# Patient Record
Sex: Female | Born: 1995 | Hispanic: No | Marital: Married | Smoking: Never smoker
Health system: Southern US, Community
[De-identification: ages and names within clinical notes are randomized; demographics above are authoritative.]

## PROBLEM LIST (undated history)

## (undated) ENCOUNTER — Inpatient Hospital Stay (HOSPITAL_COMMUNITY): Payer: Self-pay

## (undated) DIAGNOSIS — O209 Hemorrhage in early pregnancy, unspecified: Secondary | ICD-10-CM

## (undated) DIAGNOSIS — Z789 Other specified health status: Secondary | ICD-10-CM

## (undated) HISTORY — PX: NASAL ENDOSCOPY: SHX286

---

## 2014-05-01 ENCOUNTER — Inpatient Hospital Stay (HOSPITAL_COMMUNITY)
Admission: AD | Admit: 2014-05-01 | Discharge: 2014-05-02 | Disposition: A | Discharge status: Home / Self Care | Payer: Managed Care, Other (non HMO) | Source: Ambulatory Visit | Attending: Obstetrics & Gynecology | Admitting: Obstetrics & Gynecology

## 2014-05-01 ENCOUNTER — Encounter (HOSPITAL_COMMUNITY): Payer: Self-pay | Admitting: *Deleted

## 2014-05-01 DIAGNOSIS — R109 Unspecified abdominal pain: Secondary | ICD-10-CM | POA: Diagnosis present

## 2014-05-01 DIAGNOSIS — O200 Threatened abortion: Secondary | ICD-10-CM | POA: Insufficient documentation

## 2014-05-01 DIAGNOSIS — O209 Hemorrhage in early pregnancy, unspecified: Secondary | ICD-10-CM | POA: Diagnosis present

## 2014-05-01 DIAGNOSIS — O039 Complete or unspecified spontaneous abortion without complication: Secondary | ICD-10-CM

## 2014-05-01 HISTORY — DX: Hemorrhage in early pregnancy, unspecified: O20.9

## 2014-05-01 LAB — CBC
HCT: 38.8 % (ref 36.0–46.0)
Hemoglobin: 12.6 g/dL (ref 12.0–15.0)
MCH: 28.7 pg (ref 26.0–34.0)
MCHC: 32.5 g/dL (ref 30.0–36.0)
MCV: 88.4 fL (ref 78.0–100.0)
Platelets: 223 10*3/uL (ref 150–400)
RBC: 4.39 MIL/uL (ref 3.87–5.11)
RDW: 12.9 % (ref 11.5–15.5)
WBC: 8.8 10*3/uL (ref 4.0–10.5)

## 2014-05-01 NOTE — MAU Note (Signed)
Pt presents with husband who is speaking for pt, reports vaginal bleeding and pain since this pm, positive preg test

## 2014-05-02 ENCOUNTER — Encounter (HOSPITAL_COMMUNITY): Payer: Self-pay | Admitting: Certified Nurse Midwife

## 2014-05-02 ENCOUNTER — Inpatient Hospital Stay (HOSPITAL_COMMUNITY): Payer: Managed Care, Other (non HMO)

## 2014-05-02 DIAGNOSIS — O209 Hemorrhage in early pregnancy, unspecified: Secondary | ICD-10-CM

## 2014-05-02 HISTORY — DX: Hemorrhage in early pregnancy, unspecified: O20.9

## 2014-05-02 LAB — SAMPLE TO BLOOD BANK

## 2014-05-02 LAB — HCG, QUANTITATIVE, PREGNANCY: hCG, Beta Chain, Quant, S: 4054 m[IU]/mL — ABNORMAL HIGH (ref <5)

## 2014-05-02 NOTE — Discharge Instructions (Signed)
Miscarriage  A miscarriage is the loss of an unborn baby early in pregnancy. The cause is often unknown.  HOME CARE  You may be able to do light activity - rest at home until the bleeding has stopped.  Have help at home.  Only take medicine as told by your doctor.  Do not take aspirin.  Keep all doctor visits as told.  Give yourself time to grieve before trying to get pregnant again. GET HELP RIGHT AWAY IF:  You have bad cramps or pain in your back or belly.  You have a fever.  You pass large clumps of blood (clots) from your vagina that are walnut-sized or larger.   You pass large amounts of tissue from your vagina.   You have heavy  bleeding.  You have thick, bad-smelling fluid (discharge) coming from the vagina.  You get lightheaded, weak, or you pass out (faint).  You have chills. MAKE SURE YOU:  Understand these instructions.  Will watch your condition.  Will get help right away if you are not doing well or get worse.   Document Released: 11/11/2011 Document Reviewed: 11/11/2011 [facility] Patient Information 2015 [place], [place]. This information is not intended to replace advice given to you by your health care provider. Make sure you discuss any questions you have with your health care provider.

## 2014-05-02 NOTE — MAU Provider Note (Signed)
Reviewed and agree with note and plan. V.Erland Vivas, MD  

## 2014-05-02 NOTE — MAU Provider Note (Signed)
  History    CSN: [id number]  Arrival date and time: 05/01/14 2246 Nurse call to provider @ 2321 Provider here to evaluate patient @ 2400   No chief complaint on file.  HPI : positive pregnancy test confirmed at [facility] last week          cramping and bleeding tonight  History reviewed. No pertinent past medical history.  History reviewed. No pertinent past surgical history.  History reviewed. No pertinent family history.  History  Substance Use Topics  . Smoking status: Not on file  . Smokeless tobacco: Never Used  . Alcohol Use: No   Allergies: Allergies not on file  No prescriptions prior to admission   ROS bleeding - bright red / saturating pad 1 per hour x 2 hours cramping and pain weak and dizzy  Physical Exam   Blood pressure 140/86, pulse 95, temperature 98.4 F (36.9 C), temperature source Oral, resp. rate 18, height  (1.651 m), weight 70.308 kg (155 lb), last menstrual period 03/15/2014, SpO2 100.00%.  Physical Exam alert and oriented - anxious abdomen soft and non-tender speculum exam - moderate red blood in vaginal vault                             - cervix appears closed with small red bloody mucus from os                            - uterus retroverted  CBC: 8.8 - 12.6 - 38.8 - 223 Quant : 4054  ABO/RH pending from office from 8/27 - obtain results in AM  plan Rhophylac in office today if RH negative  MAU Course  Procedures - sono   CLINICAL DATA: Bleeding and pain, quantitative beta HCG 4,054.   EXAM:  OBSTETRIC <14 WK [place] AND TRANSVAGINAL OB US   TECHNIQUE:  Both transabdominal and transvaginal ultrasound examinations were  performed for complete evaluation of the gestation as well as the  maternal uterus, adnexal regions, and pelvic cul-de-sac.  Transvaginal technique was performed to assess early pregnancy.  COMPARISON: None.   FINDINGS:  Intrauterine gestational sac: Not identified  Yolk sac: Not identified  Embryo: Not  identified  Maternal uterus/adnexae: Small amount of fluid is noted within the  endometrial cavity. Normal sonographic appearance to the ovaries. No  free fluid.   IMPRESSION:  Small amount of fluid within the endometrial cavity. No intrauterine  gestation. Recommend correlation with serial quantitative beta HCG.  Assessment and Plan  [redacted] weeks pregnant with bleeding - most likely consistent with SAB discussed results with patient and family - emotional & distraught with grief  1) DC home 2) follow-up call from office this AM - need type and RH from office / decision for Rhophylac 3) repeat Quant and OV with Dr Juliene Pina in 2 days 4) grief support   Marlinda Mike 05/02/2014, 12:08 AM

## 2014-06-05 ENCOUNTER — Inpatient Hospital Stay (HOSPITAL_COMMUNITY)
Admission: AD | Admit: 2014-06-05 | Discharge: 2014-06-05 | Disposition: A | Discharge status: Home / Self Care | Payer: Managed Care, Other (non HMO) | Source: Ambulatory Visit | Attending: Obstetrics & Gynecology | Admitting: Obstetrics & Gynecology

## 2014-06-05 ENCOUNTER — Encounter (HOSPITAL_COMMUNITY): Payer: Self-pay | Admitting: *Deleted

## 2014-06-05 DIAGNOSIS — O200 Threatened abortion: Secondary | ICD-10-CM | POA: Insufficient documentation

## 2014-06-05 DIAGNOSIS — O4691 Antepartum hemorrhage, unspecified, first trimester: Secondary | ICD-10-CM | POA: Diagnosis present

## 2014-06-05 DIAGNOSIS — Z3A01 Less than 8 weeks gestation of pregnancy: Secondary | ICD-10-CM | POA: Diagnosis not present

## 2014-06-05 HISTORY — DX: Other specified health status: Z78.9

## 2014-06-05 LAB — HCG, SERUM, QUALITATIVE: Preg, Serum: POSITIVE — AB

## 2014-06-05 LAB — HCG, QUANTITATIVE, PREGNANCY: hCG, Beta Chain, Quant, S: 4483 m[IU]/mL — ABNORMAL HIGH (ref <5)

## 2014-06-05 NOTE — MAU Note (Signed)
Pt notified of need for blood work to confirm pregnancy. Pt verbalized understanding and will wait for bloodwork

## 2014-06-05 NOTE — MAU Note (Signed)
Received orders from Marlinda Mikeanya Bailey CNM for qualitative Hcg. If positive, order quantitative Hcg

## 2014-06-05 NOTE — MAU Note (Signed)
Lab spoke with pt regarding bloodwork. Pt requesting to try to give urine sample again before having blood drawn

## 2014-06-05 NOTE — MAU Note (Signed)
Pt's husband came to desk & said that they had been waiting in a waiting room down the hall. Requested that they wait in the MAU lobby so we could find them.

## 2014-06-05 NOTE — MAU Note (Signed)
Pt not in lobby.  

## 2014-06-05 NOTE — MAU Provider Note (Signed)
  History     CSN: [id number]  Arrival date and time: 06/05/14 1820   Chief Complaint  Patient presents with  . Vaginal Bleeding  . Possible Pregnancy   HPI [id number], LMP 8/19, c/o spotting on wiping once today, recent SAB in July, pt pregnant in next cycle. Some pelvic pain.  Rh positive per office records    Past Medical History  Diagnosis Date  . Bleeding in early pregnancy 05/02/2014  . Medical history non-contributory     Past Surgical History  Procedure Laterality Date  . Nasal endoscopy      History reviewed. No pertinent family history.  History  Substance Use Topics  . Smoking status: Never Smoker   . Smokeless tobacco: Never Used  . Alcohol Use: No    Allergies: No Known Allergies  Prescriptions prior to admission  Medication Sig Dispense Refill  . prenatal vitamin w/FE, FA (PRENATAL 1 + 1) 27-1 MG TABS tablet Take 1 tablet by mouth daily at 12 noon.        ROS Physical Exam   Blood pressure 138/79, pulse 96, temperature 97.8 F (36.6 C), resp. rate 18, last menstrual period 04/20/2014.  Physical Exam VS nl  Abdomen soft Pelvic deferred since one spotting noted.   MAU Course  Procedures Quant - 4400, progesterone level sent   Assessment and Plan  Threatened abortion, 6 wks. Needs repeat quant in 48 hrs in office and progesterone supplement if low level.  Pelvic rest. F/up office for lab in 48 hrs and then sono after rise noted.    Indi Willhite R 06/05/2014, 10:56 PM

## 2014-06-05 NOTE — MAU Note (Signed)
Pt presents to MAU with complaints of a + HPT two days ago and started having vaginal bleeding today

## 2014-06-05 NOTE — MAU Note (Signed)
[facility] and ready to have blood drawn. Lab called to draw blood

## 2014-06-05 NOTE — Progress Notes (Signed)
Notified of pt in MAU and inability to urinate to confirm positive home pregnancy test

## 2014-06-06 LAB — PROGESTERONE: Progesterone: 6.8 ng/mL

## 2014-07-05 ENCOUNTER — Encounter (HOSPITAL_COMMUNITY): Payer: Self-pay | Admitting: *Deleted

## 2014-12-05 ENCOUNTER — Emergency Department (HOSPITAL_COMMUNITY)
Admission: EM | Admit: 2014-12-05 | Discharge: 2014-12-06 | Discharge status: Against Medical Advice | Payer: PPO | Attending: Emergency Medicine | Admitting: Emergency Medicine

## 2014-12-05 ENCOUNTER — Encounter (HOSPITAL_COMMUNITY): Payer: Self-pay

## 2014-12-05 DIAGNOSIS — R509 Fever, unspecified: Secondary | ICD-10-CM | POA: Diagnosis not present

## 2014-12-05 DIAGNOSIS — R110 Nausea: Secondary | ICD-10-CM | POA: Diagnosis not present

## 2014-12-05 NOTE — ED Notes (Signed)
Pt complains of feeling nauseated, no vomiting, and feeling really hot today, no fever currently in triage

## 2014-12-06 LAB — CBC WITH DIFFERENTIAL/PLATELET
Basophils Absolute: 0 10*3/uL (ref 0.0–0.1)
Basophils Relative: 0 % (ref 0–1)
EOS ABS: 0 10*3/uL (ref 0.0–0.7)
Eosinophils Relative: 0 % (ref 0–5)
HCT: 42 % (ref 36.0–46.0)
Hemoglobin: 13.1 g/dL (ref 12.0–15.0)
LYMPHS PCT: 10 % — AB (ref 12–46)
Lymphs Abs: 0.7 10*3/uL (ref 0.7–4.0)
MCH: 27.3 pg (ref 26.0–34.0)
MCHC: 31.2 g/dL (ref 30.0–36.0)
MCV: 87.7 fL (ref 78.0–100.0)
Monocytes Absolute: 0.4 10*3/uL (ref 0.1–1.0)
Monocytes Relative: 5 % (ref 3–12)
Neutro Abs: 5.9 10*3/uL (ref 1.7–7.7)
Neutrophils Relative %: 85 % — ABNORMAL HIGH (ref 43–77)
Platelets: 219 10*3/uL (ref 150–400)
RBC: 4.79 MIL/uL (ref 3.87–5.11)
RDW: 12.8 % (ref 11.5–15.5)
WBC: 7 10*3/uL (ref 4.0–10.5)

## 2014-12-06 LAB — COMPREHENSIVE METABOLIC PANEL
ALBUMIN: 4.2 g/dL (ref 3.5–5.2)
ALK PHOS: 46 U/L (ref 39–117)
ALT: 13 U/L (ref 0–35)
AST: 19 U/L (ref 0–37)
Anion gap: 8 (ref 5–15)
BILIRUBIN TOTAL: 0.9 mg/dL (ref 0.3–1.2)
BUN: 16 mg/dL (ref 6–23)
CO2: 22 mmol/L (ref 19–32)
Calcium: 8.7 mg/dL (ref 8.4–10.5)
Chloride: 106 mmol/L (ref 96–112)
Creatinine, Ser: 0.66 mg/dL (ref 0.50–1.10)
GFR calc Af Amer: >90 mL/min (ref >90)
Glucose, Bld: 114 mg/dL — ABNORMAL HIGH (ref 70–99)
POTASSIUM: 3.6 mmol/L (ref 3.5–5.1)
Sodium: 136 mmol/L (ref 135–145)
TOTAL PROTEIN: 7.4 g/dL (ref 6.0–8.3)

## 2014-12-06 NOTE — ED Notes (Signed)
Pt advised Registration that they were not waiting any longer; pt left AMA

## 2015-11-03 IMAGING — US US OB TRANSVAGINAL
1 series · 14 of 28 positions shown · non-contrast
Comparison: None.

CLINICAL DATA: Bleeding and pain, quantitative beta HCG [DATE].

EXAM:
OBSTETRIC <14 WK US AND TRANSVAGINAL OB US
TECHNIQUE: Both transabdominal and transvaginal ultrasound examinations were
performed for complete evaluation of the gestation as well as the
maternal uterus, adnexal regions, and pelvic cul-de-sac.
Transvaginal technique was performed to assess early pregnancy.

[Series 1: us ob comp less 14 wks · 14 of 32 slices shown]
[im 2/32]
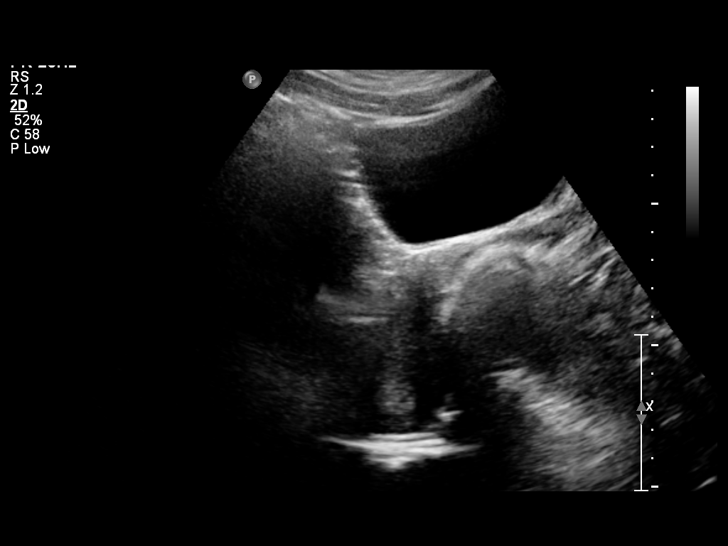
[im 4/32]
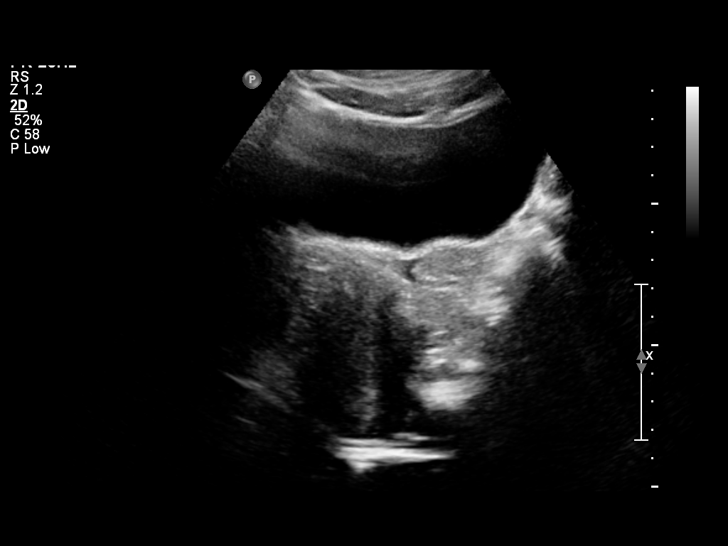
[im 6/32]
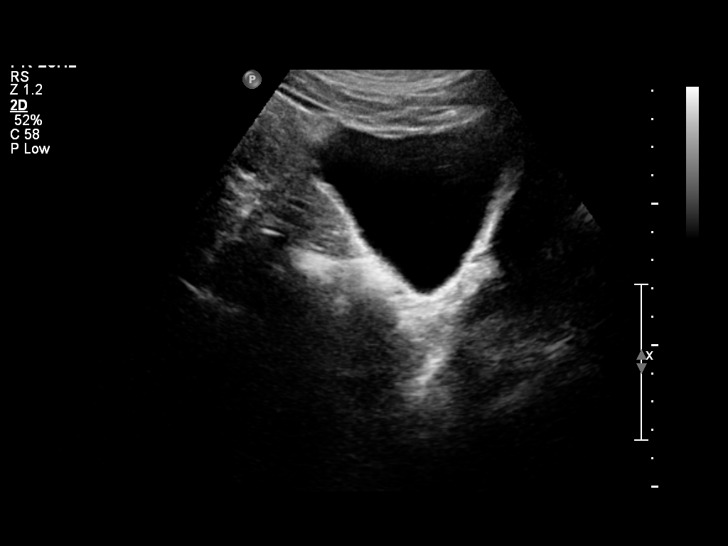
[im 9/32]
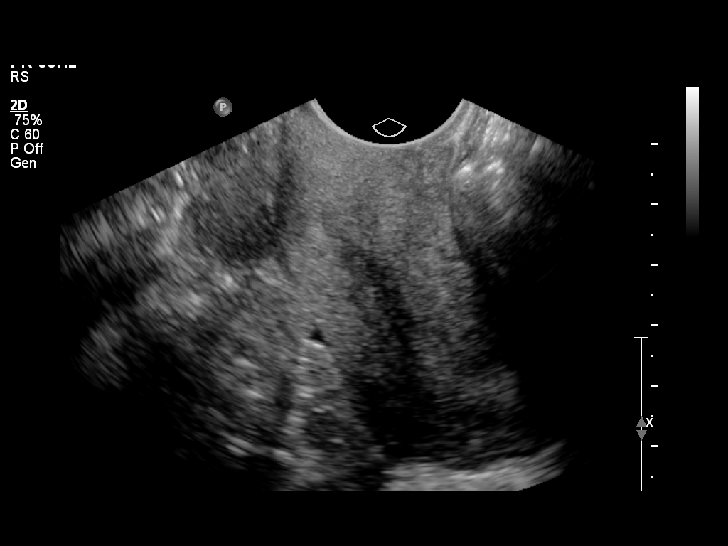
[im 11/32]
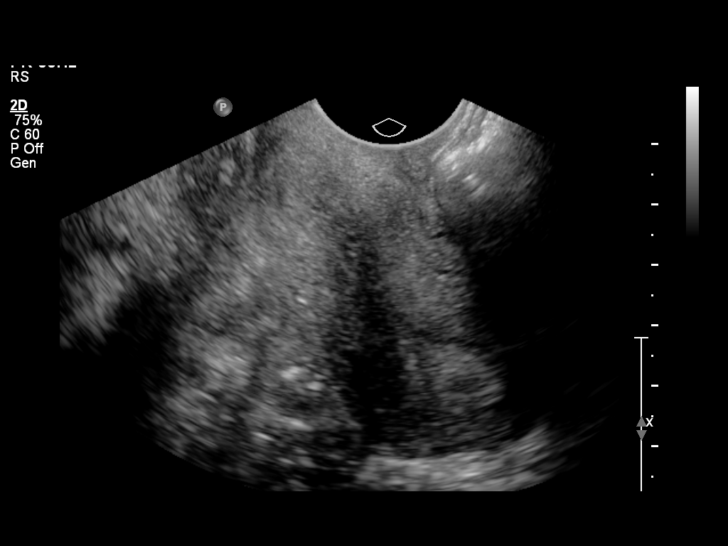
[im 13/32]
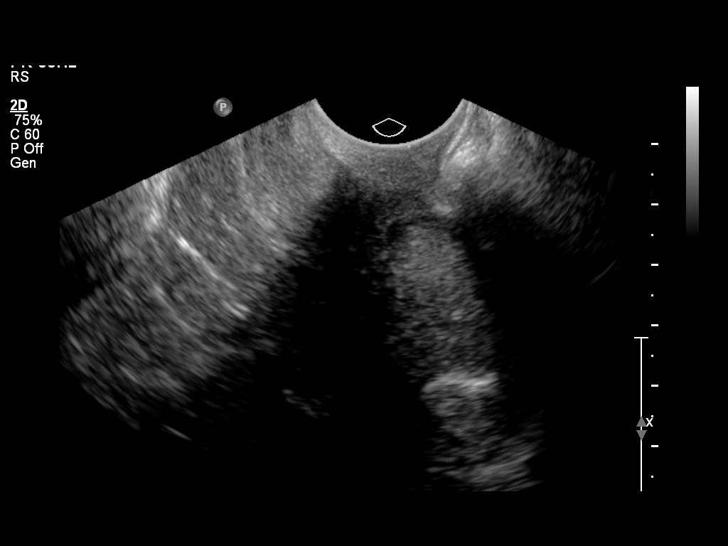
[im 15/32]
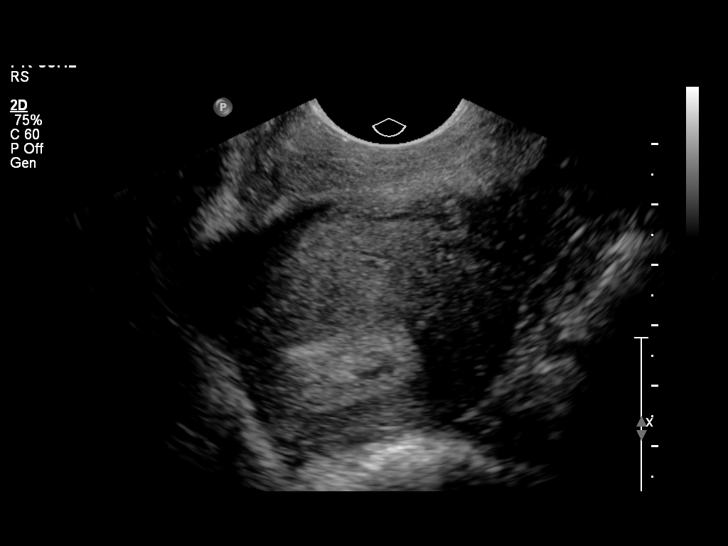
[im 18/32]
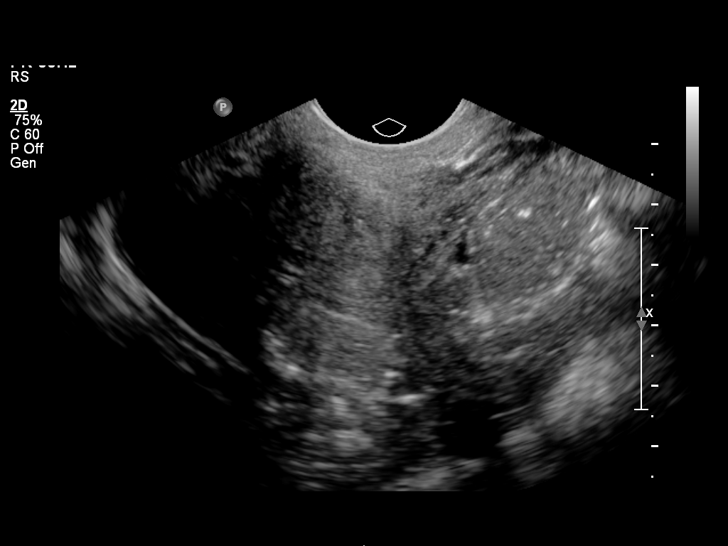
[im 20/32]
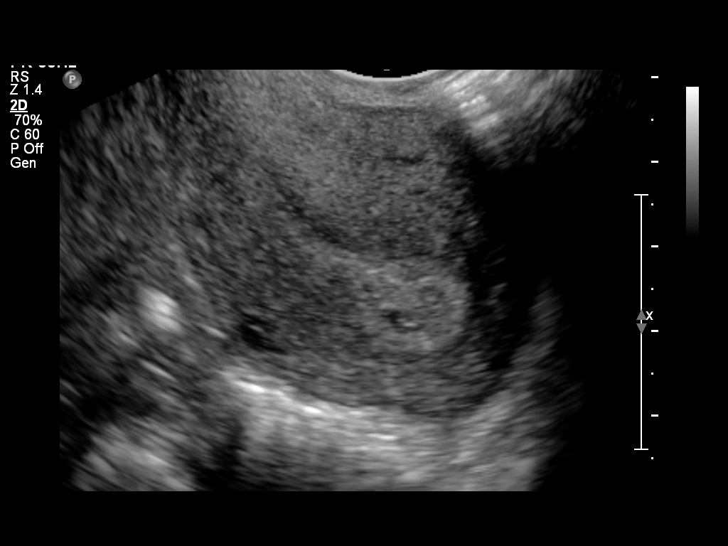
[im 22/32]
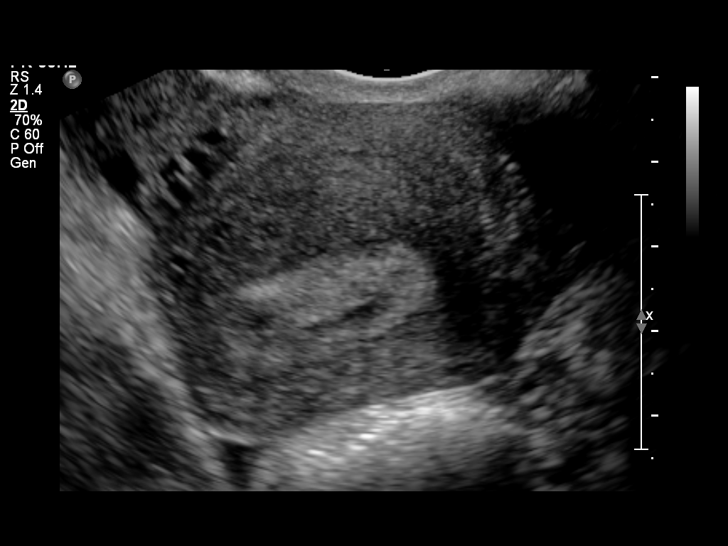
[im 25/32]
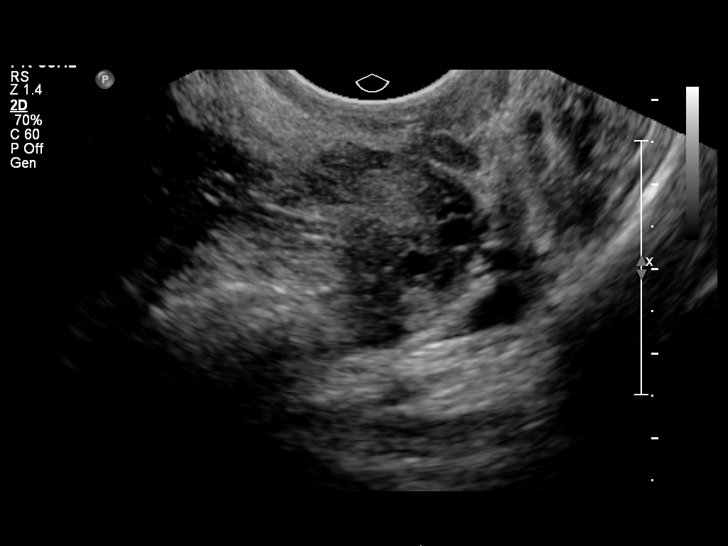
[im 27/32]
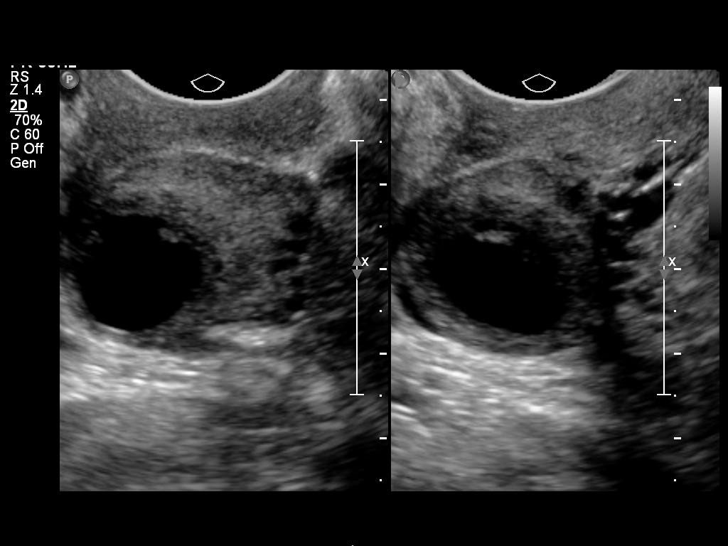
[im 29/32]
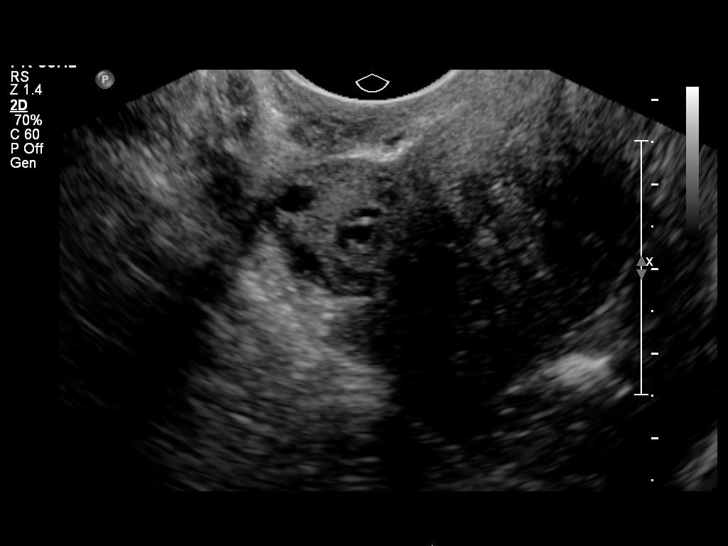
[im 32/32]
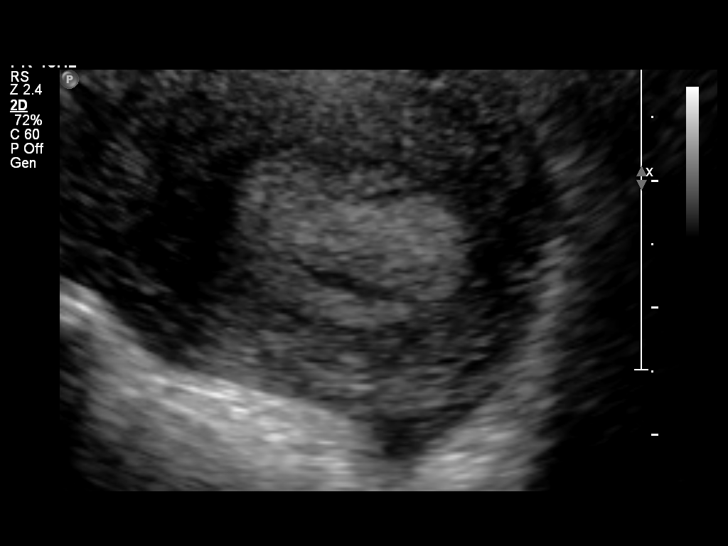

[14 of 28 positions shown; findings below may reference images not displayed]

FINDINGS: Intrauterine gestational sac: Not identified

Yolk sac:  Not identified

Embryo:  Not identified

Maternal uterus/adnexae: Small amount of fluid is noted within the
endometrial cavity. Normal sonographic appearance to the ovaries. No
free fluid.
IMPRESSION: Small amount of fluid within the endometrial cavity. No intrauterine
gestation.

Recommend correlation with serial quantitative beta HCG.
